# Patient Record
Sex: Male | Born: 1990 | Race: Black or African American | Hispanic: No | Marital: Single | Smoking: Former smoker
Health system: Southern US, Community
[De-identification: ages and names within clinical notes are randomized; demographics above are authoritative.]

---
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
## 2016-02-17 ENCOUNTER — Ambulatory Visit (INDEPENDENT_AMBULATORY_CARE_PROVIDER_SITE_OTHER): Payer: BLUE CROSS/BLUE SHIELD | Admitting: Family Medicine

## 2016-02-17 ENCOUNTER — Encounter: Payer: Self-pay | Admitting: Family Medicine

## 2016-02-17 VITALS — BP 124/86 | HR 88 | Temp 98.5°F | Ht 71.0 in | Wt 276.0 lb

## 2016-02-17 DIAGNOSIS — Z0000 Encounter for general adult medical examination without abnormal findings: Secondary | ICD-10-CM | POA: Diagnosis not present

## 2016-02-17 DIAGNOSIS — IMO0001 Reserved for inherently not codable concepts without codable children: Secondary | ICD-10-CM

## 2016-02-17 LAB — URINALYSIS
Bilirubin, UA: NEGATIVE
GLUCOSE, UA: NEGATIVE
Ketones, UA: NEGATIVE
LEUKOCYTES UA: NEGATIVE
Nitrite, UA: NEGATIVE
Protein, UA: NEGATIVE
RBC, UA: NEGATIVE
Specific Gravity, UA: >1.03 — ABNORMAL HIGH (ref 1.005–1.030)
UUROB: 0.2 mg/dL (ref 0.2–1.0)
pH, UA: 5.5 (ref 5.0–7.5)

## 2016-02-17 NOTE — Progress Notes (Signed)
Subjective:  Patient ID: Norman Jackson, male    DOB: January 24, 1991  Age: 25 y.o. MRN: [id number]  CC: Establish Care and Annual Exam ([facility])   HPI Norman Jackson presents for New patient, annual wellness visit. Has form for fitness to [employer] the [employer] to be completed. Pt. Concerned that at a pre-employment exam a year or so ago he was told his thyroid lab was abnormal - too high,    History Norman Jackson has no history of significant illness   He has had no surgeries   His family history includes Hypertension in his mother.He reports that he quit smoking about a year ago. His smoking use included Cigarettes. He started smoking about 4 years ago. He does not have any smokeless tobacco history on file. He reports that he drinks alcohol. He reports that he does not use drugs.    ROS Review of Systems  Constitutional: Negative for chills, diaphoresis and fever.  HENT: Negative for congestion, ear pain, hearing loss, nosebleeds, sore throat and tinnitus.   Eyes: Negative for photophobia, pain, discharge and redness.  Respiratory: Negative for cough, shortness of breath and wheezing.   Cardiovascular: Negative for chest pain, palpitations and leg swelling.  Gastrointestinal: Negative for abdominal pain, blood in stool, constipation, diarrhea, nausea and vomiting.  Endocrine: Negative for polydipsia.  Genitourinary: Negative for dysuria, flank pain, frequency, hematuria and urgency.  Musculoskeletal: Negative for back pain, myalgias and neck pain.  Skin: Negative for rash.  Allergic/Immunologic: Negative for environmental allergies.  Neurological: Negative for dizziness, tremors, seizures, weakness and headaches.  Hematological: Does not bruise/bleed easily.  Psychiatric/Behavioral: Negative for hallucinations and suicidal ideas. The patient is not nervous/anxious.     Objective:  BP 124/86   Pulse 88   Temp 98.5 F (36.9 C) (Oral)   Ht _0  (1.803 m)   Wt 276 lb (125.2 kg)    SpO2 99%   BMI 38.49 kg/m   BP Readings from Last 3 Encounters:  02/17/16 124/86    Wt Readings from Last 3 Encounters:  02/17/16 276 lb (125.2 kg)     Physical Exam  Constitutional: He is oriented to person, place, and time. He appears well-developed and well-nourished.  HENT:  Head: Normocephalic and atraumatic.  Mouth/Throat: Oropharynx is clear and moist.  Eyes: EOM are normal. Pupils are equal, round, and reactive to light.  Neck: Normal range of motion. No tracheal deviation present. No thyromegaly present.  Cardiovascular: Normal rate, regular rhythm and normal heart sounds.  Exam reveals no gallop and no friction rub.   No murmur heard. Pulmonary/Chest: Breath sounds normal. He has no wheezes. He has no rales.  Abdominal: Soft. He exhibits no mass. There is no tenderness.  Musculoskeletal: Normal range of motion. He exhibits no edema.  Neurological: He is alert and oriented to person, place, and time.  Skin: Skin is warm and dry.  Psychiatric: He has a normal mood and affect.       Assessment & Plan:   Norman Jackson was seen today for establish care and annual exam.  Diagnoses and all orders for this visit:  Well adult -     CBC with Differential/Platelet -     CMP14+EGFR -     TSH -     Urinalysis -     Lipid panel      Norman Jackson does not currently have medications on file.  No orders of the defined types were placed in this encounter.    Follow-up:  Return in about 1 year (around 02/16/2017) for Wellness.  Claretta Fraise, M.D.

## 2016-02-18 LAB — CBC WITH DIFFERENTIAL/PLATELET
BASOS ABS: 0 10*3/uL (ref 0.0–0.2)
Basos: 0 %
EOS (ABSOLUTE): 0.1 10*3/uL (ref 0.0–0.4)
Eos: 1 %
HEMOGLOBIN: 15.6 g/dL (ref 12.6–17.7)
Hematocrit: 45.4 % (ref 37.5–51.0)
Immature Grans (Abs): 0 10*3/uL (ref 0.0–0.1)
Immature Granulocytes: 0 %
LYMPHS ABS: 2.5 10*3/uL (ref 0.7–3.1)
Lymphs: 26 %
MCH: 29.9 pg (ref 26.6–33.0)
MCHC: 34.4 g/dL (ref 31.5–35.7)
MCV: 87 fL (ref 79–97)
MONOCYTES: 8 %
Monocytes Absolute: 0.7 10*3/uL (ref 0.1–0.9)
NEUTROS ABS: 6.3 10*3/uL (ref 1.4–7.0)
Neutrophils: 65 %
Platelets: 207 10*3/uL (ref 150–379)
RBC: 5.21 x10E6/uL (ref 4.14–5.80)
RDW: 13.6 % (ref 12.3–15.4)
WBC: 9.6 10*3/uL (ref 3.4–10.8)

## 2016-02-18 LAB — CMP14+EGFR
A/G RATIO: 1.6 (ref 1.2–2.2)
ALBUMIN: 4.4 g/dL (ref 3.5–5.5)
ALK PHOS: 100 IU/L (ref 39–117)
ALT: 19 IU/L (ref 0–44)
AST: 20 IU/L (ref 0–40)
BILIRUBIN TOTAL: 0.4 mg/dL (ref 0.0–1.2)
BUN / CREAT RATIO: 14 (ref 9–20)
BUN: 16 mg/dL (ref 6–20)
CHLORIDE: 101 mmol/L (ref 96–106)
CO2: 23 mmol/L (ref 18–29)
Calcium: 9.5 mg/dL (ref 8.7–10.2)
Creatinine, Ser: 1.11 mg/dL (ref 0.76–1.27)
GFR calc non Af Amer: 91 mL/min/{1.73_m2} (ref >59)
GFR, EST AFRICAN AMERICAN: 105 mL/min/{1.73_m2} (ref >59)
GLOBULIN, TOTAL: 2.7 g/dL (ref 1.5–4.5)
Glucose: 96 mg/dL (ref 65–99)
POTASSIUM: 4.3 mmol/L (ref 3.5–5.2)
SODIUM: 141 mmol/L (ref 134–144)
TOTAL PROTEIN: 7.1 g/dL (ref 6.0–8.5)

## 2016-02-18 LAB — LIPID PANEL
CHOL/HDL RATIO: 4.3 ratio (ref 0.0–5.0)
Cholesterol, Total: 170 mg/dL (ref 100–199)
HDL: 40 mg/dL (ref >39)
LDL CALC: 100 mg/dL — AB (ref 0–99)
TRIGLYCERIDES: 150 mg/dL — AB (ref 0–149)
VLDL Cholesterol Cal: 30 mg/dL (ref 5–40)

## 2016-02-18 LAB — TSH: TSH: 11.7 u[IU]/mL — ABNORMAL HIGH (ref 0.450–4.500)

## 2016-02-20 LAB — T4, FREE: FREE T4: 1.11 ng/dL (ref 0.82–1.77)

## 2016-02-20 LAB — SPECIMEN STATUS REPORT

## 2016-02-21 ENCOUNTER — Other Ambulatory Visit: Payer: Self-pay | Admitting: Family Medicine

## 2016-02-21 MED ORDER — LEVOTHYROXINE SODIUM 50 MCG PO TABS: 50.0000 ug | ORAL_TABLET | Freq: Every day | ORAL | 3 refills | Status: AC

## 2016-02-24 ENCOUNTER — Encounter: Payer: Self-pay | Admitting: *Deleted

## 2016-06-27 ENCOUNTER — Ambulatory Visit: Payer: BLUE CROSS/BLUE SHIELD | Admitting: Family Medicine

## 2016-06-28 ENCOUNTER — Encounter: Payer: Self-pay | Admitting: Family Medicine

## 2016-12-08 ENCOUNTER — Encounter: Payer: Self-pay | Admitting: Nurse Practitioner

## 2016-12-08 ENCOUNTER — Ambulatory Visit (INDEPENDENT_AMBULATORY_CARE_PROVIDER_SITE_OTHER): Payer: BLUE CROSS/BLUE SHIELD | Admitting: Nurse Practitioner

## 2016-12-08 VITALS — BP 124/87 | HR 99 | Temp 97.3°F | Ht 71.0 in | Wt 266.0 lb

## 2016-12-08 DIAGNOSIS — J029 Acute pharyngitis, unspecified: Secondary | ICD-10-CM

## 2016-12-08 DIAGNOSIS — J0100 Acute maxillary sinusitis, unspecified: Secondary | ICD-10-CM

## 2016-12-08 MED ORDER — AMOXICILLIN 875 MG PO TABS: 875.0000 mg | ORAL_TABLET | Freq: Two times a day (BID) | ORAL | 0 refills | Status: AC

## 2016-12-08 NOTE — Patient Instructions (Signed)

## 2016-12-08 NOTE — Progress Notes (Signed)
   Subjective:    Patient ID: Norman Jackson, male    DOB: August 26, 1990, 26 y.o.   MRN: [id number]  HPI Patient comes in by hisself today with c/o cough and congestion that started 3 days ago- has had chills and sweats. SOre throat  And ear pain started yesterday.    Review of Systems  Constitutional: Positive for chills and fever (?).  HENT: Positive for congestion, ear pain (right), sinus pressure, sore throat and voice change. Negative for trouble swallowing.   Respiratory: Positive for cough (nonproductive).   Cardiovascular: Negative.   Gastrointestinal: Negative.   Genitourinary: Negative.   Neurological: Negative.   Psychiatric/Behavioral: Negative.   All other systems reviewed and are negative.      Objective:   Physical Exam  Constitutional: He is oriented to person, place, and time. He appears well-developed and well-nourished. No distress.  HENT:  Right Ear: Hearing, tympanic membrane, external ear and ear canal normal.  Left Ear: Hearing, tympanic membrane, external ear and ear canal normal.  Nose: Mucosal edema and rhinorrhea present. Right sinus exhibits maxillary sinus tenderness. Right sinus exhibits no frontal sinus tenderness. Left sinus exhibits maxillary sinus tenderness. Left sinus exhibits no frontal sinus tenderness.  Mouth/Throat: Uvula is midline and mucous membranes are normal. Posterior oropharyngeal erythema present.  Neck: Normal range of motion. Neck supple.  Cardiovascular: Normal rate and regular rhythm.   Pulmonary/Chest: Effort normal and breath sounds normal.  Lymphadenopathy:    He has no cervical adenopathy.  Neurological: He is alert and oriented to person, place, and time.  Skin: Skin is warm.  Psychiatric: He has a normal mood and affect. His behavior is normal. Judgment and thought content normal.   BP 124/87   Pulse 99   Temp 97.3 F (36.3 C) (Oral)   Ht 5\' 11"  (1.803 m)   Wt 266 lb (120.7 kg)   BMI 37.10 kg/m      Assessment &  Plan:   1. Pharyngitis, unspecified etiology   2. Acute maxillary sinusitis, recurrence not specified    Meds ordered this encounter  Medications  . amoxicillin (AMOXIL) 875 MG tablet    Sig: Take 1 tablet (875 mg total) by mouth 2 (two) times daily. 1 po BID    Dispense:  20 tablet    Refill:  0    Order Specific Question:   Supervising Provider    Answer:   VINCENT, CAROL L [[id number]]   1. Take meds as prescribed 2. Use a cool mist humidifier especially during the winter months and when heat has been humid. 3. Use saline nose sprays frequently 4. Saline irrigations of the nose can be very helpful if done frequently.  * 4X daily for 1 week*  * Use of a nettie pot can be helpful with this. Follow directions with this* 5. Drink plenty of fluids 6. Keep thermostat turn down low 7.For any cough or congestion  Use plain Mucinex- regular strength or max strength is fine   * Children- consult with Pharmacist for dosing 8. For fever or aces or pains- take tylenol or ibuprofen appropriate for age and weight.  * for fevers greater than 101 orally you may alternate ibuprofen and tylenol every  3 hours.   Mary-Margaret Daphine DeutscherMartin, FNP

## 2020-03-26 ENCOUNTER — Other Ambulatory Visit: Payer: Self-pay | Admitting: Critical Care Medicine

## 2020-03-26 ENCOUNTER — Other Ambulatory Visit: Payer: Self-pay

## 2020-03-26 DIAGNOSIS — Z20822 Contact with and (suspected) exposure to covid-19: Secondary | ICD-10-CM

## 2020-03-28 LAB — NOVEL CORONAVIRUS, NAA: SARS-CoV-2, NAA: NOT DETECTED

## 2020-04-02 ENCOUNTER — Other Ambulatory Visit: Payer: Self-pay

## 2021-04-25 ENCOUNTER — Emergency Department (HOSPITAL_COMMUNITY): Payer: Self-pay

## 2021-04-25 ENCOUNTER — Other Ambulatory Visit: Payer: Self-pay

## 2021-04-25 ENCOUNTER — Encounter (HOSPITAL_COMMUNITY): Payer: Self-pay | Admitting: Emergency Medicine

## 2021-04-25 ENCOUNTER — Emergency Department (HOSPITAL_COMMUNITY)
Admission: EM | Admit: 2021-04-25 | Discharge: 2021-04-25 | Disposition: A | Discharge status: Home / Self Care | Payer: Self-pay | Attending: Emergency Medicine | Admitting: Emergency Medicine

## 2021-04-25 DIAGNOSIS — N201 Calculus of ureter: Secondary | ICD-10-CM | POA: Insufficient documentation

## 2021-04-25 DIAGNOSIS — Z87891 Personal history of nicotine dependence: Secondary | ICD-10-CM | POA: Insufficient documentation

## 2021-04-25 LAB — I-STAT CHEM 8, ED
BUN: 17 mg/dL (ref 6–20)
Calcium, Ion: 1.28 mmol/L (ref 1.15–1.40)
Chloride: 106 mmol/L (ref 98–111)
Creatinine, Ser: 1.3 mg/dL — ABNORMAL HIGH (ref 0.61–1.24)
Glucose, Bld: 128 mg/dL — ABNORMAL HIGH (ref 70–99)
HCT: 46 % (ref 39.0–52.0)
Hemoglobin: 15.6 g/dL (ref 13.0–17.0)
Potassium: 4.5 mmol/L (ref 3.5–5.1)
Sodium: 143 mmol/L (ref 135–145)
TCO2: 27 mmol/L (ref 22–32)

## 2021-04-25 LAB — COMPREHENSIVE METABOLIC PANEL
ALT: 22 U/L (ref 0–44)
AST: 24 U/L (ref 15–41)
Albumin: 4.5 g/dL (ref 3.5–5.0)
Alkaline Phosphatase: 66 U/L (ref 38–126)
Anion gap: 9 (ref 5–15)
BUN: 17 mg/dL (ref 6–20)
CO2: 27 mmol/L (ref 22–32)
Calcium: 9.4 mg/dL (ref 8.9–10.3)
Chloride: 105 mmol/L (ref 98–111)
Creatinine, Ser: 1.23 mg/dL (ref 0.61–1.24)
GFR, Estimated: >60 mL/min (ref >60)
Glucose, Bld: 134 mg/dL — ABNORMAL HIGH (ref 70–99)
Potassium: 4.6 mmol/L (ref 3.5–5.1)
Sodium: 141 mmol/L (ref 135–145)
Total Bilirubin: 0.8 mg/dL (ref 0.3–1.2)
Total Protein: 7.9 g/dL (ref 6.5–8.1)

## 2021-04-25 LAB — CBC WITH DIFFERENTIAL/PLATELET
Abs Immature Granulocytes: 0.03 10*3/uL (ref 0.00–0.07)
Basophils Absolute: 0.1 10*3/uL (ref 0.0–0.1)
Basophils Relative: 1 %
Eosinophils Absolute: 0.1 10*3/uL (ref 0.0–0.5)
Eosinophils Relative: 1 %
HCT: 46.9 % (ref 39.0–52.0)
Hemoglobin: 15.8 g/dL (ref 13.0–17.0)
Immature Granulocytes: 0 %
Lymphocytes Relative: 14 %
Lymphs Abs: 1.3 10*3/uL (ref 0.7–4.0)
MCH: 31 pg (ref 26.0–34.0)
MCHC: 33.7 g/dL (ref 30.0–36.0)
MCV: 92 fL (ref 80.0–100.0)
Monocytes Absolute: 0.6 10*3/uL (ref 0.1–1.0)
Monocytes Relative: 6 %
Neutro Abs: 7.6 10*3/uL (ref 1.7–7.7)
Neutrophils Relative %: 78 %
Platelets: 214 10*3/uL (ref 150–400)
RBC: 5.1 MIL/uL (ref 4.22–5.81)
RDW: 12.1 % (ref 11.5–15.5)
WBC: 9.8 10*3/uL (ref 4.0–10.5)
nRBC: 0 % (ref 0.0–0.2)

## 2021-04-25 LAB — URINALYSIS, ROUTINE W REFLEX MICROSCOPIC
Bilirubin Urine: NEGATIVE
Glucose, UA: NEGATIVE mg/dL
Ketones, ur: 5 mg/dL — AB
Leukocytes,Ua: NEGATIVE
Nitrite: NEGATIVE
Protein, ur: 30 mg/dL — AB
RBC / HPF: >50 RBC/hpf — ABNORMAL HIGH (ref 0–5)
Specific Gravity, Urine: 1.033 — ABNORMAL HIGH (ref 1.005–1.030)
pH: 5 (ref 5.0–8.0)

## 2021-04-25 LAB — LIPASE, BLOOD: Lipase: 30 U/L (ref 11–51)

## 2021-04-25 MED ORDER — TAMSULOSIN HCL 0.4 MG PO CAPS: 0.4000 mg | ORAL_CAPSULE | Freq: Every day | ORAL | 0 refills | Status: AC

## 2021-04-25 MED ORDER — KETOROLAC TROMETHAMINE 60 MG/2ML IM SOLN
60.0000 mg | Freq: Once | INTRAMUSCULAR | Status: AC
Start: 2021-04-25 — End: 2021-04-25
  Administered 2021-04-25: 60 mg via INTRAMUSCULAR
  Filled 2021-04-25: qty 2

## 2021-04-25 MED ORDER — HYDROCODONE-ACETAMINOPHEN 5-325 MG PO TABS
1.0000 | ORAL_TABLET | Freq: Once | ORAL | Status: AC
  Administered 2021-04-25: 1 via ORAL
  Filled 2021-04-25: qty 1

## 2021-04-25 MED ORDER — HYDROCODONE-ACETAMINOPHEN 5-325 MG PO TABS: 1.0000 | ORAL_TABLET | ORAL | 0 refills | Status: AC | PRN

## 2021-04-25 NOTE — ED Triage Notes (Signed)
Patient states that around midnight, he began having pain in his RLQ that radiates into his right flank after having a bowel movement. Patient states that pain has been increasing. Patient denies urinary symptoms. Patient noted to be unable to sit still in triage, diaphoretic.

## 2021-04-25 NOTE — ED Provider Notes (Signed)
[facility]-EMERGENCY DEPT Provider Note   CSN: [id number] Arrival date & time: 04/25/21  0127     History Chief Complaint  Patient presents with   Abdominal Pain   Flank Pain    Norman Jackson is a 30 y.o. male.  HPI 30 year old male with no significant medical his presents the ER with complaints of right flank pain radiating into the abdomen.  This started after bowel movement earlier this morning.  Denies any fevers or chills.  No nausea or vomiting.  Denies any dysuria or hematuria.  Endorses 10 out of 10 pain.  No history of kidney stones.  Arrives to the ER very uncomfortable, diaphoretic.  He does still have his appendix.    History reviewed. No pertinent past medical history.  There are no problems to display for this patient.   History reviewed. No pertinent surgical history.     Family History  Problem Relation Age of Onset   Hypertension Mother     Social History   Tobacco Use   Smoking status: Former    Types: Cigarettes    Start date: 02/17/2012    Quit date: 02/17/2015    Years since quitting: 6.1   Smokeless tobacco: Never  Substance Use Topics   Alcohol use: Yes    Comment: occasional   Drug use: No    Home Medications Prior to Admission medications   Medication Sig Start Date End Date Taking? Authorizing Provider  HYDROcodone-acetaminophen (NORCO/VICODIN) 5-325 MG tablet Take 1 tablet by mouth every 4 (four) hours as needed for up to 3 days. 04/25/21 04/28/21 Yes Mare Ferrari, PA-C  tamsulosin (FLOMAX) 0.4 MG CAPS capsule Take 1 capsule (0.4 mg total) by mouth daily for 15 days. 04/25/21 05/10/21 Yes Mare Ferrari, PA-C  amoxicillin (AMOXIL) 875 MG tablet Take 1 tablet (875 mg total) by mouth 2 (two) times daily. 1 po BID 12/08/16   Daphine Deutscher, Mary-Margaret, FNP  levothyroxine (SYNTHROID, LEVOTHROID) 50 MCG tablet Take 1 tablet (50 mcg total) by mouth daily. Patient not taking: Reported on 12/08/2016 02/21/16   Mechele Claude,  MD    Allergies    Patient has no known allergies.  Review of Systems   Review of Systems Ten systems reviewed and are negative for acute change, except as noted in the HPI.   Physical Exam Updated Vital Signs BP (!) 164/107 (BP Location: Right Arm)   Pulse 90   Temp 98.1 F (36.7 C) (Oral)   Resp 20   Ht 5\' 11"  (1.803 m)   Wt 122.5 kg   SpO2 97%   BMI 37.66 kg/m   Physical Exam Constitutional:      Appearance: Normal appearance. He is diaphoretic. He is not ill-appearing.  HENT:     Head: Normocephalic and atraumatic.  Eyes:     Extraocular Movements: Extraocular movements intact.     Conjunctiva/sclera: Conjunctivae normal.     Pupils: Pupils are equal, round, and reactive to light.  Cardiovascular:     Rate and Rhythm: Normal rate and regular rhythm.     Pulses: Normal pulses.     Heart sounds: Normal heart sounds.  Pulmonary:     Effort: Pulmonary effort is normal.     Breath sounds: Normal breath sounds.  Abdominal:     General: Abdomen is flat. Bowel sounds are normal. There is no distension.     Palpations: Abdomen is soft. There is no mass.     Tenderness: There is abdominal tenderness in  the right lower quadrant. There is right CVA tenderness. There is no left CVA tenderness, guarding or rebound.     Hernia: No hernia is present.  Musculoskeletal:        General: Normal range of motion.     Cervical back: Normal range of motion. No tenderness.  Skin:    General: Skin is warm.  Neurological:     General: No focal deficit present.     Mental Status: He is alert and oriented to person, place, and time.  Psychiatric:        Mood and Affect: Mood normal.        Behavior: Behavior normal.    ED Results / Procedures / Treatments   Labs (all labs ordered are listed, but only abnormal results are displayed) Labs Reviewed  URINALYSIS, ROUTINE W REFLEX MICROSCOPIC - Abnormal; Notable for the following components:      Result Value   Color, Urine AMBER (*)     APPearance HAZY (*)    Specific Gravity, Urine 1.033 (*)    Hgb urine dipstick LARGE (*)    Ketones, ur 5 (*)    Protein, ur 30 (*)    RBC / HPF >50 (*)    Bacteria, UA RARE (*)    All other components within normal limits  COMPREHENSIVE METABOLIC PANEL - Abnormal; Notable for the following components:   Glucose, Bld 134 (*)    All other components within normal limits  I-STAT CHEM 8, ED - Abnormal; Notable for the following components:   Creatinine, Ser 1.30 (*)    Glucose, Bld 128 (*)    All other components within normal limits  CBC WITH DIFFERENTIAL/PLATELET  LIPASE, BLOOD    EKG None  Radiology CT Renal Stone Study  Result Date: 04/25/2021 CLINICAL DATA:  Right-sided flank pain EXAM: CT ABDOMEN AND PELVIS WITHOUT CONTRAST TECHNIQUE: Multidetector CT imaging of the abdomen and pelvis was performed following the standard protocol without IV contrast. COMPARISON:  06/21/2006 FINDINGS: Lower chest: No acute abnormality. Hepatobiliary: No focal liver abnormality is seen. No gallstones, gallbladder wall thickening, or biliary dilatation. Pancreas: Unremarkable. No pancreatic ductal dilatation or surrounding inflammatory changes. Spleen: Normal in size without focal abnormality. Adrenals/Urinary Tract: Adrenal glands are within normal limits. Kidneys are well visualized bilaterally. No renal calculi are seen. Mild fullness of the right renal collecting system and right ureter is noted. This extends to the level of the right UVJ where a tiny 1 mm stone is identified. Bladder is decompressed. Left collecting system and ureter are within normal limits. Stomach/Bowel: No obstructive or inflammatory changes of the colon are seen. Mild diverticular changes noted without diverticulitis. The appendix is within normal limits. Small bowel and stomach are unremarkable. Vascular/Lymphatic: No significant vascular findings are present. No enlarged abdominal or pelvic lymph nodes. Reproductive: Prostate  is unremarkable. Other: No abdominal wall hernia or abnormality. No abdominopelvic ascites. Musculoskeletal: No acute or significant osseous findings. IMPRESSION: 1 mm distal right ureteral stone with mild obstructive changes. No other focal abnormality is noted. Electronically Signed   By: Alcide Clever M.D.   On: 04/25/2021 03:30    Procedures Procedures   Medications Ordered in ED Medications  ketorolac (TORADOL) injection 60 mg (60 mg Intramuscular Given 04/25/21 0217)    ED Course  I have reviewed the triage vital signs and the nursing notes.  Pertinent labs & imaging results that were available during my care of the patient were reviewed by me and considered in my medical  decision making (see chart for details).    MDM Rules/Calculators/A&P                          30 year old male with right flank pain radiating into the abdomen.  On arrival, he looks very uncomfortable, however afebrile, not tachycardic, tachypneic or hypoxic.  He has right CVA tenderness and right lower quadrant tenderness on exam.  His work-up shows a elevated creatinine of 1.3 on i-STAT however normal on CMP.  CBC without leukocytosis.  Lipase normal.  UA consistent with large hemoglobin, ketonuria, proteinuria, more than 50 RBCs and rare bacteria.  CT renal study confirming 1 mm stone with mild obstructive changes.  UA with rare bacteria but no other signs of infection.  Patient is pain improved significantly after Toradol.  Patient encouraged to take 800 mg of ibuprofen every 8, will prescribe Norco for breakthrough pain.  Will prescribe Flomax.  Urology referral provided.  We discussed return precautions.  He was understanding and is agreeable.  Stable for discharge.  Final Clinical Impression(s) / ED Diagnoses Final diagnoses:  Right ureteral stone    Rx / DC Orders ED Discharge Orders          Ordered    HYDROcodone-acetaminophen (NORCO/VICODIN) 5-325 MG tablet  Every 4 hours PRN        04/25/21 0400     tamsulosin (FLOMAX) 0.4 MG CAPS capsule  Daily        04/25/21 0400             Leone Brand 04/25/21 0401    Palumbo, April, MD 04/25/21 [phone]

## 2021-04-25 NOTE — Discharge Instructions (Addendum)
Your work-up today showed a kidney stone in your right kidney.  It is small and likely will pass on its own.  Please take Flomax once daily to help facilitate passing it, make sure to drink plenty of water.  Please take ibuprofen 800 mg every 8 hours for no more than 7 days, use the Vicodin for breakthrough pain.  Please follow-up with [facility] whose contact information is provided in your discharge paperwork.  Return to the ER for any new or worsening symptoms.

## 2021-04-25 NOTE — ED Provider Notes (Signed)
Emergency Medicine Provider Triage Evaluation Note  Norman Jackson , a 30 y.o. male  was evaluated in triage.  Pt complains of right-sided flank pain wrapping to the anterior portion of his abdomen which started after bowel movement at around 12 AM.  Patient rates pain 10 out of 10.  Denies any dysuria, hematuria.  No nausea or vomiting.  He does arrive to the ER.  Not very uncomfortable and diaphoretic.  Denies any history of kidney stones.  Review of Systems  Positive: As above Negative: As above  Physical Exam  BP (!) 164/107 (BP Location: Right Arm)   Pulse 90   Temp 98.1 F (36.7 C) (Oral)   Resp 20   Ht 5\' 11"  (1.803 m)   Wt 122.5 kg   SpO2 97%   BMI 37.66 kg/m  Gen:   Awake, no distress   Resp:  Normal effort  MSK:   Moves extremities without difficulty  Other:  Right-sided CVA tenderness and right lower quadrant abdominal tenderness  Medical Decision Making  Medically screening exam initiated at 2:04 AM.  Appropriate orders placed.  Norman Jackson was informed that the remainder of the evaluation will be completed by another provider, this initial triage assessment does not replace that evaluation, and the importance of remaining in the ED until their evaluation is complete.     Palmyra Sink 04/25/21 06/25/21, April, MD 04/25/21 [phone]

## 2021-04-25 NOTE — ED Notes (Signed)
Labeled specimen cup provided to pt for urine collection per MD order. ENMiles 

## 2021-04-25 NOTE — ED Notes (Signed)
Pt given sprite to drink. 

## 2022-02-14 IMAGING — CT CT RENAL STONE PROTOCOL
2 of 4 series · 16 of 46 positions shown, 18 images · non-contrast
Comparison: 06/21/2006

CLINICAL DATA: Right-sided flank pain

EXAM:
CT ABDOMEN AND PELVIS WITHOUT CONTRAST
TECHNIQUE: Multidetector CT imaging of the abdomen and pelvis was performed
following the standard protocol without IV contrast.

[Series 2: axial st · axial · 0.80mm/px · z∈[-539,-89]mm · 13 of 100 slices shown, 15 images]
[im 5/100  soft-tissue]
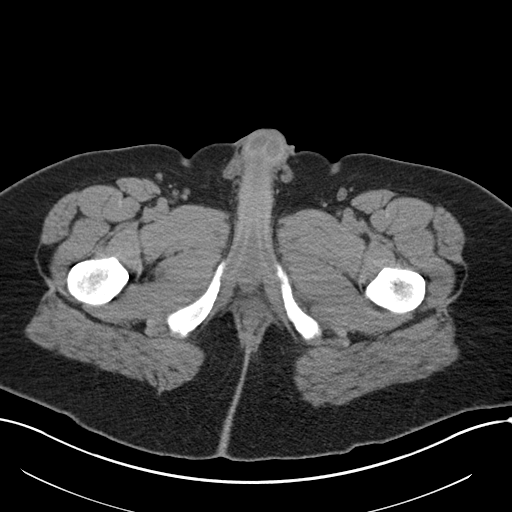
[im 5/100  bone]
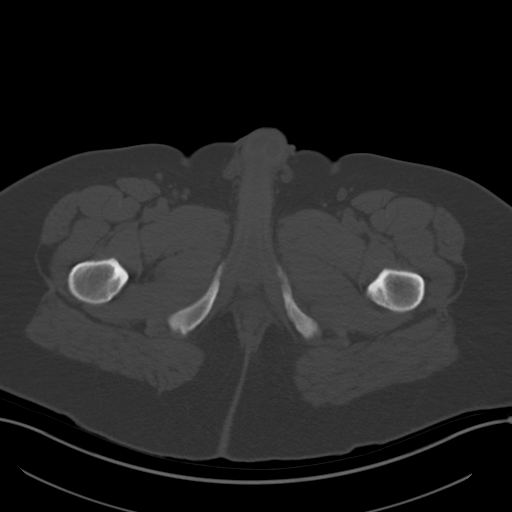
[im 15/100  soft-tissue]
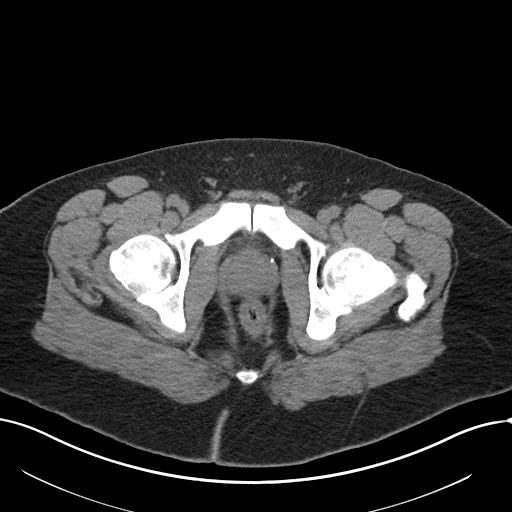
[im 20/100  soft-tissue]
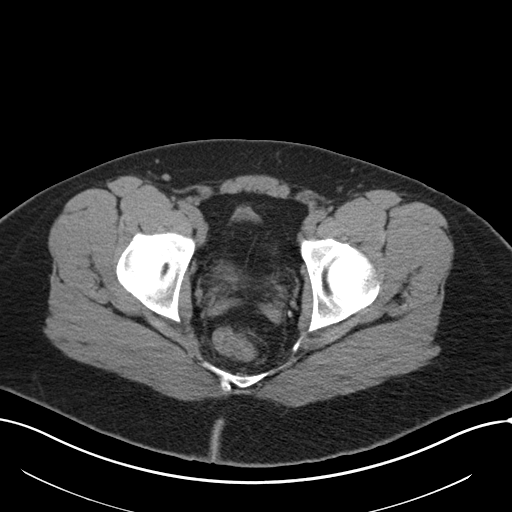
[im 30/100  soft-tissue]
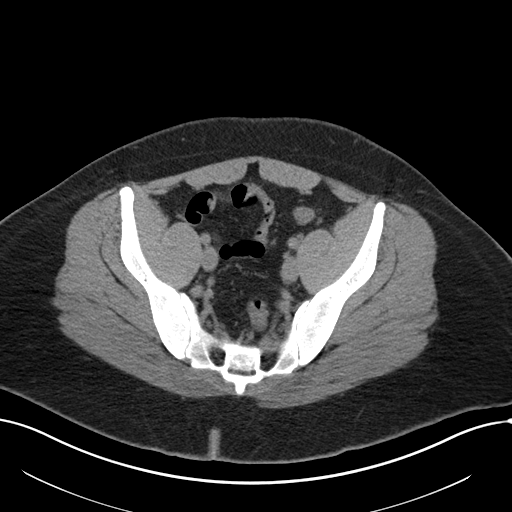
[im 35/100  soft-tissue]
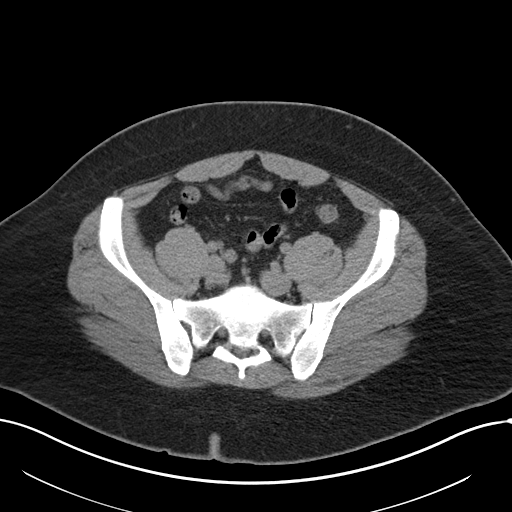
[im 45/100  soft-tissue]
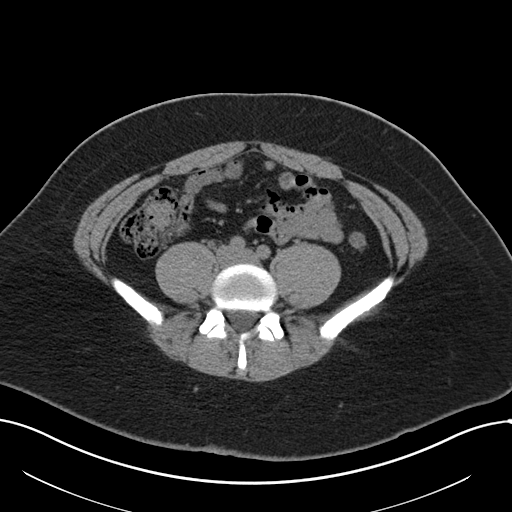
[im 50/100  soft-tissue]
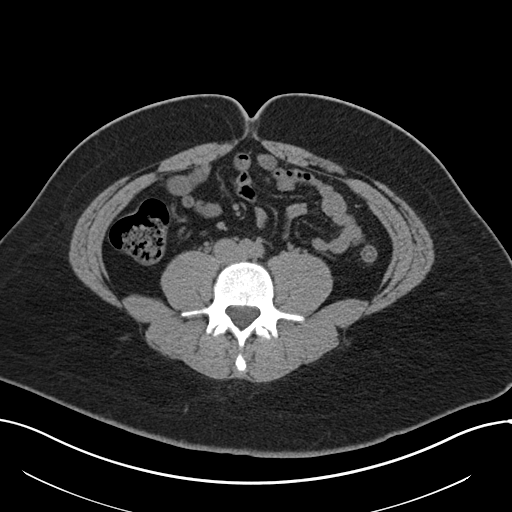
[im 55/100  soft-tissue]
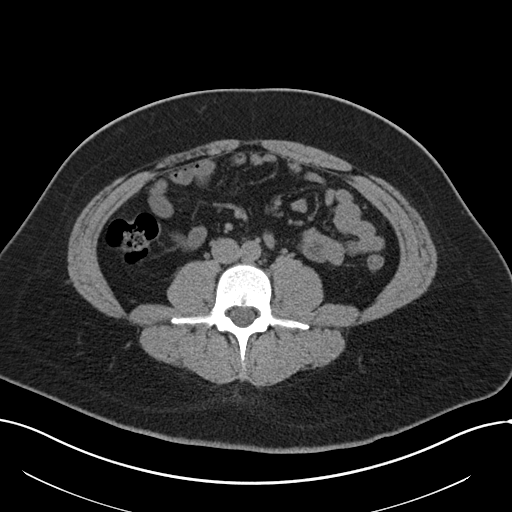
[im 65/100  soft-tissue]
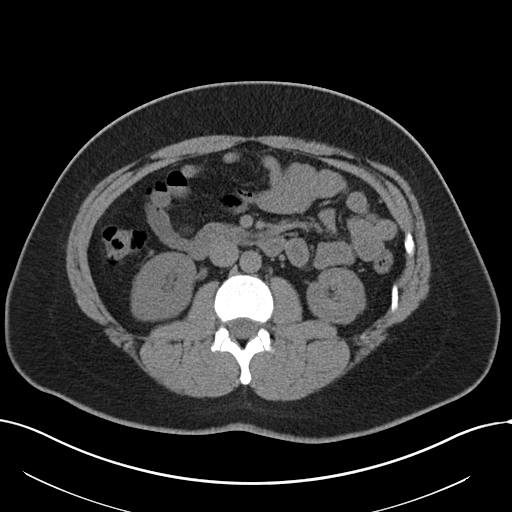
[im 65/100  bone]
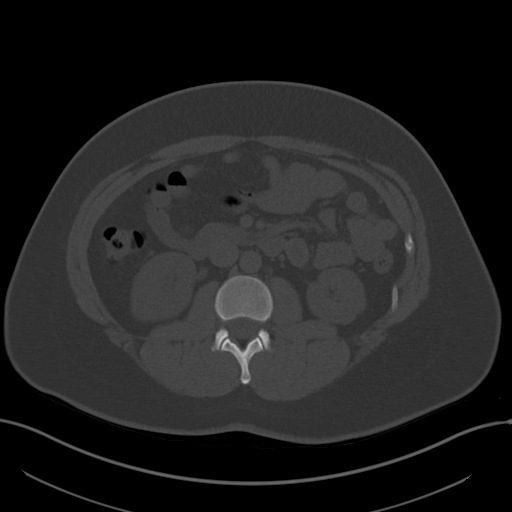
[im 70/100  soft-tissue]
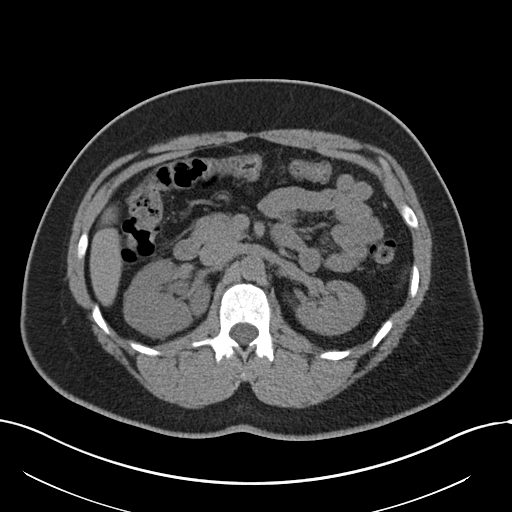
[im 80/100  soft-tissue]
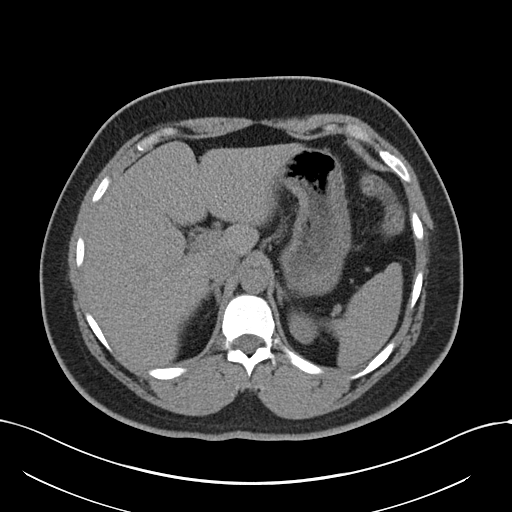
[im 85/100  soft-tissue]
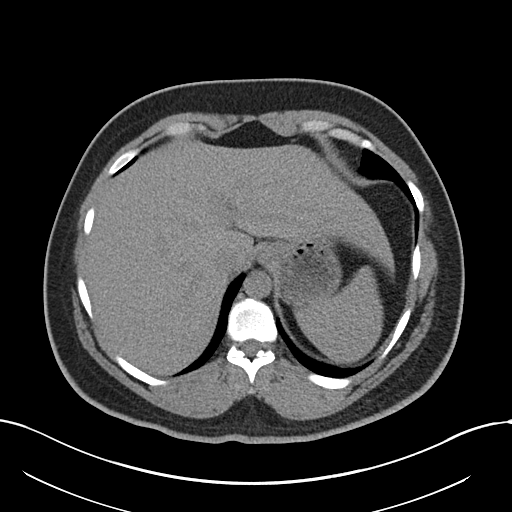
[im 95/100  soft-tissue]
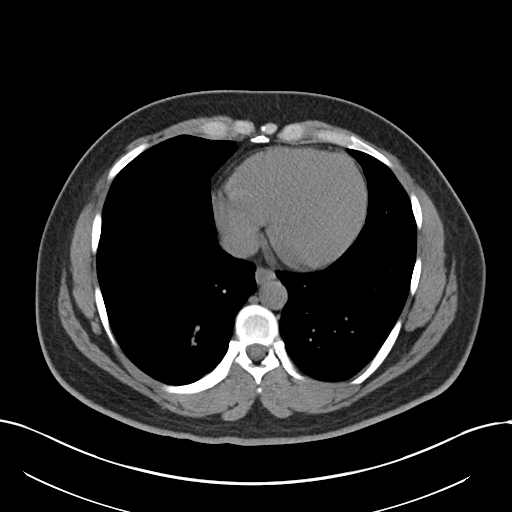

[Series 5: coronal · coronal · 0.90mm/px · 3 of 160 slices shown]
[im 54/160  soft-tissue]
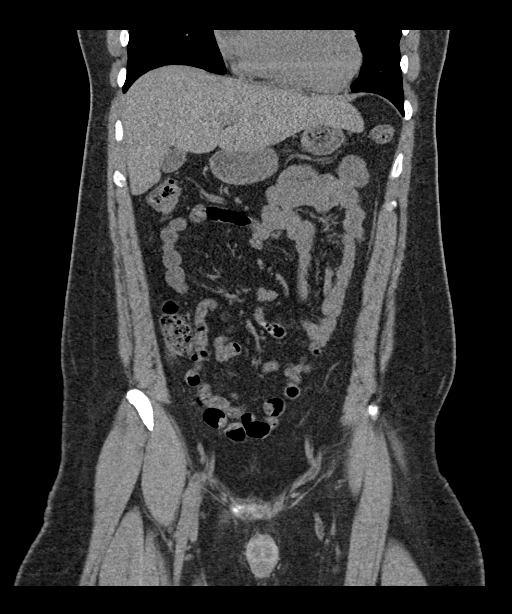
[im 71/160  soft-tissue]
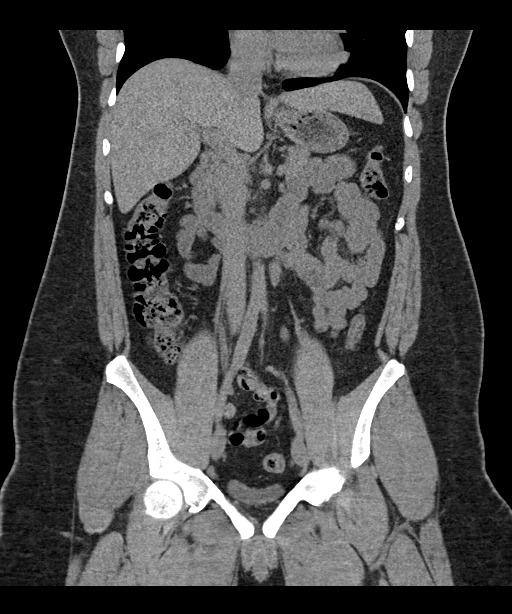
[im 89/160  soft-tissue]
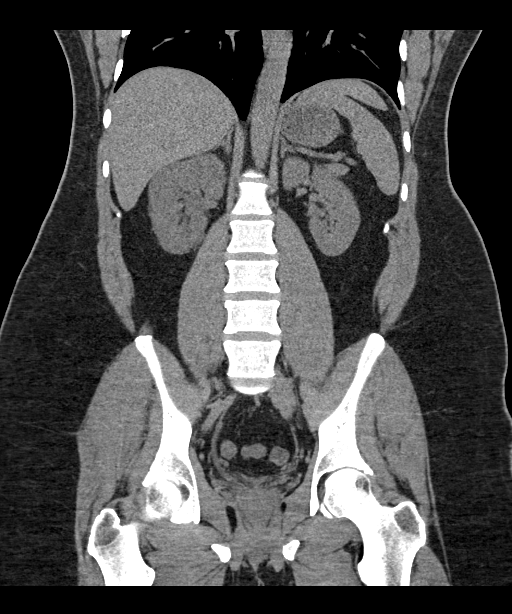

[16 of 46 positions shown; findings below may reference images not displayed]

FINDINGS: Lower chest: No acute abnormality.

Hepatobiliary: No focal liver abnormality is seen. No gallstones,
gallbladder wall thickening, or biliary dilatation.

Pancreas: Unremarkable. No pancreatic ductal dilatation or
surrounding inflammatory changes.

Spleen: Normal in size without focal abnormality.

Adrenals/Urinary Tract: Adrenal glands are within normal limits.
Kidneys are well visualized bilaterally. No renal calculi are seen.
Mild fullness of the right renal collecting system and right ureter
is noted. This extends to the level of the right UVJ where a tiny 1
mm stone is identified. Bladder is decompressed. Left collecting
system and ureter are within normal limits.

Stomach/Bowel: No obstructive or inflammatory changes of the colon
are seen. Mild diverticular changes noted without diverticulitis.
The appendix is within normal limits. Small bowel and stomach are
unremarkable.

Vascular/Lymphatic: No significant vascular findings are present. No
enlarged abdominal or pelvic lymph nodes.

Reproductive: Prostate is unremarkable.

Other: No abdominal wall hernia or abnormality. No abdominopelvic
ascites.

Musculoskeletal: No acute or significant osseous findings.
IMPRESSION: 1 mm distal right ureteral stone with mild obstructive changes.

No other focal abnormality is noted.

## 2023-01-29 NOTE — Progress Notes (Unsigned)
   Office Visit Note  Patient: Norman Jackson             Date of Birth: Jun 28, 1991           MRN: [id number]             PCP: Mechele Claude, MD Referring: Vickey Sages, FNP Visit Date: 01/30/2023 Occupation: @GUAROCC @  Subjective:  No chief complaint on file.   History of Present Illness: Norman Jackson is a 32 y.o. male ***     Activities of Daily Living:  Patient reports morning stiffness for *** {minute/hour:19697}.   Patient {ACTIONS;DENIES/REPORTS:21021675::"Denies"} nocturnal pain.  Difficulty dressing/grooming: {ACTIONS;DENIES/REPORTS:21021675::"Denies"} Difficulty climbing stairs: {ACTIONS;DENIES/REPORTS:21021675::"Denies"} Difficulty getting out of chair: {ACTIONS;DENIES/REPORTS:21021675::"Denies"} Difficulty using hands for taps, buttons, cutlery, and/or writing: {ACTIONS;DENIES/REPORTS:21021675::"Denies"}  No Rheumatology ROS completed.   PMFS History:  There are no problems to display for this patient.   No past medical history on file.  Family History  Problem Relation Age of Onset   Hypertension Mother    No past surgical history on file. Social History   Social History Narrative   Not on file    There is no immunization history on file for this patient.   Objective: Vital Signs: There were no vitals taken for this visit.   Physical Exam   Musculoskeletal Exam: ***  CDAI Exam: CDAI Score: -- Patient Global: --; Provider Global: -- Swollen: --; Tender: -- Joint Exam 01/30/2023   No joint exam has been documented for this visit   There is currently no information documented on the homunculus. Go to the Rheumatology activity and complete the homunculus joint exam.  Investigation: No additional findings.  Imaging: No results found.  Recent Labs: Lab Results  Component Value Date   WBC 9.8 04/25/2021   HGB 15.6 04/25/2021   PLT 214 04/25/2021   NA 143 04/25/2021   K 4.5 04/25/2021   CL 106 04/25/2021   CO2 27 04/25/2021    GLUCOSE 128 (H) 04/25/2021   BUN 17 04/25/2021   CREATININE 1.30 (H) 04/25/2021   BILITOT 0.8 04/25/2021   ALKPHOS 66 04/25/2021   AST 24 04/25/2021   ALT 22 04/25/2021   PROT 7.9 04/25/2021   ALBUMIN 4.5 04/25/2021   CALCIUM 9.4 04/25/2021   GFRAA 105 02/17/2016    Speciality Comments: No specialty comments available.  Procedures:  No procedures performed Allergies: Patient has no known allergies.   Assessment / Plan:     Visit Diagnoses: No diagnosis found.  Orders: No orders of the defined types were placed in this encounter.  No orders of the defined types were placed in this encounter.   Face-to-face time spent with patient was *** minutes. Greater than 50% of time was spent in counseling and coordination of care.  Follow-Up Instructions: No follow-ups on file.   Fuller Plan, MD  Note - This record has been created using [employer].  Chart creation errors have been sought, but may not always  have been located. Such creation errors do not reflect on  the standard of medical care.

## 2023-01-30 ENCOUNTER — Encounter: Payer: Self-pay | Admitting: Internal Medicine

## 2023-01-30 ENCOUNTER — Ambulatory Visit: Payer: PRIVATE HEALTH INSURANCE | Attending: Internal Medicine | Admitting: Internal Medicine

## 2023-01-30 VITALS — BP 134/89 | HR 105 | Resp 14 | Ht 71.0 in | Wt 251.0 lb

## 2023-01-30 DIAGNOSIS — R21 Rash and other nonspecific skin eruption: Secondary | ICD-10-CM

## 2023-01-30 DIAGNOSIS — R768 Other specified abnormal immunological findings in serum: Secondary | ICD-10-CM

## 2023-01-30 DIAGNOSIS — M67431 Ganglion, right wrist: Secondary | ICD-10-CM | POA: Diagnosis not present

## 2023-01-30 DIAGNOSIS — Z8639 Personal history of other endocrine, nutritional and metabolic disease: Secondary | ICD-10-CM

## 2023-01-31 LAB — ANTI-SMITH ANTIBODY: ENA SM Ab Ser-aCnc: <1 AI

## 2023-01-31 LAB — TSH: TSH: 7.06 mIU/L — ABNORMAL HIGH (ref 0.40–4.50)

## 2023-01-31 LAB — RNP ANTIBODY: Ribonucleic Protein(ENA) Antibody, IgG: <1 AI

## 2023-01-31 LAB — ANTI-DNA ANTIBODY, DOUBLE-STRANDED: ds DNA Ab: 1 IU/mL

## 2023-01-31 LAB — SJOGRENS SYNDROME-A EXTRACTABLE NUCLEAR ANTIBODY: SSA (Ro) (ENA) Antibody, IgG: <1 AI
